# Patient Record
Sex: Female | Born: 1945 | Race: White | Hispanic: No | Marital: Married | Smoking: Former smoker
Health system: Southern US, Community
[De-identification: ages and names within clinical notes are randomized; demographics above are authoritative.]

## PROBLEM LIST (undated history)

## (undated) DIAGNOSIS — R053 Chronic cough: Secondary | ICD-10-CM

## (undated) DIAGNOSIS — T8859XA Other complications of anesthesia, initial encounter: Secondary | ICD-10-CM

## (undated) DIAGNOSIS — T4145XA Adverse effect of unspecified anesthetic, initial encounter: Secondary | ICD-10-CM

## (undated) DIAGNOSIS — D649 Anemia, unspecified: Secondary | ICD-10-CM

## (undated) DIAGNOSIS — K5792 Diverticulitis of intestine, part unspecified, without perforation or abscess without bleeding: Secondary | ICD-10-CM

## (undated) DIAGNOSIS — K5790 Diverticulosis of intestine, part unspecified, without perforation or abscess without bleeding: Secondary | ICD-10-CM

## (undated) DIAGNOSIS — R05 Cough: Secondary | ICD-10-CM

## (undated) DIAGNOSIS — M3500 Sicca syndrome, unspecified: Secondary | ICD-10-CM

## (undated) DIAGNOSIS — D72819 Decreased white blood cell count, unspecified: Secondary | ICD-10-CM

## (undated) DIAGNOSIS — M154 Erosive (osteo)arthritis: Secondary | ICD-10-CM

## (undated) DIAGNOSIS — K219 Gastro-esophageal reflux disease without esophagitis: Secondary | ICD-10-CM

## (undated) DIAGNOSIS — F419 Anxiety disorder, unspecified: Secondary | ICD-10-CM

## (undated) HISTORY — PX: ABDOMINAL HYSTERECTOMY: SHX81

## (undated) HISTORY — PX: TUBAL LIGATION: SHX77

## (undated) HISTORY — PX: CHOLECYSTECTOMY OPEN: SUR202

## (undated) HISTORY — PX: BREAST BIOPSY: SHX20

## (undated) HISTORY — PX: UPPER GI ENDOSCOPY: SHX6162

## (undated) HISTORY — PX: APPENDECTOMY: SHX54

## (undated) HISTORY — PX: TONSILLECTOMY: SUR1361

## (undated) NOTE — Telephone Encounter (Signed)
 Formatting of this note might be different from the original. 7/29 spoke with patient about her records being forwarded patient advised me that she received a call from the office and it was taken care of already Electronically signed by Pamila Solomons at 04/11/2021 11:07 AM EDT

---

## 2012-09-14 DIAGNOSIS — M3500 Sicca syndrome, unspecified: Secondary | ICD-10-CM

## 2012-09-14 HISTORY — DX: Sjogren syndrome, unspecified: M35.00

## 2016-05-21 ENCOUNTER — Encounter (HOSPITAL_COMMUNITY): Payer: Self-pay | Admitting: Family Medicine

## 2016-05-21 DIAGNOSIS — Z87891 Personal history of nicotine dependence: Secondary | ICD-10-CM | POA: Insufficient documentation

## 2016-05-21 DIAGNOSIS — R070 Pain in throat: Secondary | ICD-10-CM | POA: Insufficient documentation

## 2016-05-21 DIAGNOSIS — J029 Acute pharyngitis, unspecified: Secondary | ICD-10-CM | POA: Diagnosis present

## 2016-05-21 DIAGNOSIS — M3504 Sicca syndrome with tubulo-interstitial nephropathy: Secondary | ICD-10-CM | POA: Diagnosis not present

## 2016-05-21 LAB — URINALYSIS, ROUTINE W REFLEX MICROSCOPIC
BILIRUBIN URINE: NEGATIVE
GLUCOSE, UA: NEGATIVE mg/dL
Ketones, ur: NEGATIVE mg/dL
Leukocytes, UA: NEGATIVE
Nitrite: NEGATIVE
PROTEIN: NEGATIVE mg/dL
SPECIFIC GRAVITY, URINE: 1.009 (ref 1.005–1.030)
pH: 7.5 (ref 5.0–8.0)

## 2016-05-21 LAB — COMPREHENSIVE METABOLIC PANEL
ALBUMIN: 4 g/dL (ref 3.5–5.0)
ALK PHOS: 53 U/L (ref 38–126)
ALT: 14 U/L (ref 14–54)
AST: 17 U/L (ref 15–41)
Anion gap: 5 (ref 5–15)
BUN: 6 mg/dL (ref 6–20)
CALCIUM: 9.5 mg/dL (ref 8.9–10.3)
CHLORIDE: 100 mmol/L — AB (ref 101–111)
CO2: 26 mmol/L (ref 22–32)
CREATININE: 0.86 mg/dL (ref 0.44–1.00)
GFR calc Af Amer: >60 mL/min (ref >60)
GFR calc non Af Amer: >60 mL/min (ref >60)
GLUCOSE: 102 mg/dL — AB (ref 65–99)
Potassium: 4.1 mmol/L (ref 3.5–5.1)
SODIUM: 131 mmol/L — AB (ref 135–145)
Total Bilirubin: 0.5 mg/dL (ref 0.3–1.2)
Total Protein: 7 g/dL (ref 6.5–8.1)

## 2016-05-21 LAB — CBC
HCT: 35.2 % — ABNORMAL LOW (ref 36.0–46.0)
Hemoglobin: 12.1 g/dL (ref 12.0–15.0)
MCH: 32.6 pg (ref 26.0–34.0)
MCHC: 34.4 g/dL (ref 30.0–36.0)
MCV: 94.9 fL (ref 78.0–100.0)
PLATELETS: 188 10*3/uL (ref 150–400)
RBC: 3.71 MIL/uL — ABNORMAL LOW (ref 3.87–5.11)
RDW: 11.6 % (ref 11.5–15.5)
WBC: 5.5 10*3/uL (ref 4.0–10.5)

## 2016-05-21 LAB — LIPASE, BLOOD: LIPASE: 54 U/L — AB (ref 11–51)

## 2016-05-21 LAB — URINE MICROSCOPIC-ADD ON

## 2016-05-21 NOTE — ED Notes (Signed)
Pt alert, NAD, calm, interactive, resps e/u, no dyspnea noted, speech clear, steady gait, updated on wait and process with rationale.

## 2016-05-21 NOTE — ED Triage Notes (Signed)
Pt presents via POV with c/o sore throat, cough x5 weeks and abdominal x3 days.  [place] [place], and does not have her records with her.  She states had endoscopy 2 days ago to r/o reflux - states no abnormalities were found.  She was taking protonix but stopped 3 days ago due to abdominal pain.  Taking Carafate x 10days with minimal relief. PT is A&O and ambulatory to treatment room, in NAD, no cough during triage assessment.

## 2016-05-21 NOTE — ED Notes (Signed)
Updated on wait. 

## 2016-05-22 ENCOUNTER — Emergency Department (HOSPITAL_COMMUNITY)
Admission: EM | Admit: 2016-05-22 | Discharge: 2016-05-22 | Disposition: A | Discharge status: Home / Self Care | Payer: Medicare PPO | Attending: Emergency Medicine | Admitting: Emergency Medicine

## 2016-05-22 ENCOUNTER — Telehealth: Payer: Self-pay | Admitting: *Deleted

## 2016-05-22 ENCOUNTER — Emergency Department (HOSPITAL_COMMUNITY): Payer: Medicare PPO

## 2016-05-22 DIAGNOSIS — R070 Pain in throat: Secondary | ICD-10-CM

## 2016-05-22 DIAGNOSIS — M3500 Sicca syndrome, unspecified: Secondary | ICD-10-CM

## 2016-05-22 HISTORY — DX: Cough: R05

## 2016-05-22 HISTORY — DX: Chronic cough: R05.3

## 2016-05-22 HISTORY — DX: Decreased white blood cell count, unspecified: D72.819

## 2016-05-22 HISTORY — DX: Diverticulitis of intestine, part unspecified, without perforation or abscess without bleeding: K57.92

## 2016-05-22 HISTORY — DX: Sicca syndrome, unspecified: M35.00

## 2016-05-22 HISTORY — DX: Diverticulosis of intestine, part unspecified, without perforation or abscess without bleeding: K57.90

## 2016-05-22 MED ORDER — MAGIC MOUTHWASH W/LIDOCAINE: 5.0000 mL | Freq: Three times a day (TID) | ORAL | 0 refills | Status: DC | PRN

## 2016-05-22 NOTE — ED Notes (Signed)
Patient left at this time with all belongings. 

## 2016-05-22 NOTE — ED Notes (Signed)
Patient anxious to leave, up in hallway at desk asking for paperwork. Provided, and apologized for wait. Encouraged to follow up with otolaryngology.
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
## 2016-05-22 NOTE — ED Notes (Signed)
Pt states she's feeling better than when she first came in. States "I'll feel better once I can go home." Believes pain may be a flare up of autoimmune issue.

## 2016-05-22 NOTE — ED Provider Notes (Signed)
Patient presented to the ER with cough and sore throat with abdominal pain. Symptoms ongoing for several days.  Face to face Exam: HEENT - PERRLA Lungs - CTAB Heart - RRR, no M/R/G Abd - S/NT/ND Neuro - alert, oriented x3  Plan: Patient has history of recurrent symptoms similar to this in the past related to Sjogren's. She also has a history of reflux. She has had extensive workup recently including endoscopy within the last week. Workup is unremarkable here in the ER.   Gilda Creasehristopher J Walfred Bettendorf, MD 05/22/16 [phone]

## 2016-05-22 NOTE — Telephone Encounter (Signed)
Pharmacy called for [employer] Mouthwash ingredients.  Magic Mouthwash with lidocaine: 120ml Magic mouthwash solution (60 Maalox Extra Strength, 60ml Nystatin Suspension, 20ml of Benedryl)  and 40ml 2% Lidocaine.

## 2016-05-26 ENCOUNTER — Encounter (HOSPITAL_COMMUNITY): Payer: Self-pay | Admitting: Emergency Medicine

## 2016-05-26 ENCOUNTER — Emergency Department (HOSPITAL_COMMUNITY): Payer: Medicare PPO

## 2016-05-26 ENCOUNTER — Observation Stay (HOSPITAL_COMMUNITY)
Admission: EM | Admit: 2016-05-26 | Discharge: 2016-05-27 | Disposition: A | Discharge status: Home / Self Care | Payer: Medicare PPO | Attending: Internal Medicine | Admitting: Internal Medicine

## 2016-05-26 DIAGNOSIS — K838 Other specified diseases of biliary tract: Secondary | ICD-10-CM | POA: Diagnosis present

## 2016-05-26 DIAGNOSIS — R195 Other fecal abnormalities: Secondary | ICD-10-CM | POA: Diagnosis present

## 2016-05-26 DIAGNOSIS — R1010 Upper abdominal pain, unspecified: Secondary | ICD-10-CM

## 2016-05-26 DIAGNOSIS — Z79890 Hormone replacement therapy (postmenopausal): Secondary | ICD-10-CM | POA: Insufficient documentation

## 2016-05-26 DIAGNOSIS — R1011 Right upper quadrant pain: Principal | ICD-10-CM | POA: Diagnosis present

## 2016-05-26 DIAGNOSIS — K915 Postcholecystectomy syndrome: Secondary | ICD-10-CM | POA: Diagnosis not present

## 2016-05-26 DIAGNOSIS — Z79899 Other long term (current) drug therapy: Secondary | ICD-10-CM | POA: Insufficient documentation

## 2016-05-26 DIAGNOSIS — K210 Gastro-esophageal reflux disease with esophagitis: Secondary | ICD-10-CM | POA: Diagnosis not present

## 2016-05-26 DIAGNOSIS — Z87891 Personal history of nicotine dependence: Secondary | ICD-10-CM | POA: Diagnosis not present

## 2016-05-26 DIAGNOSIS — K219 Gastro-esophageal reflux disease without esophagitis: Secondary | ICD-10-CM | POA: Diagnosis present

## 2016-05-26 HISTORY — DX: Erosive (osteo)arthritis: M15.4

## 2016-05-26 HISTORY — DX: Other complications of anesthesia, initial encounter: T88.59XA

## 2016-05-26 HISTORY — DX: Anemia, unspecified: D64.9

## 2016-05-26 HISTORY — DX: Anxiety disorder, unspecified: F41.9

## 2016-05-26 HISTORY — DX: Adverse effect of unspecified anesthetic, initial encounter: T41.45XA

## 2016-05-26 HISTORY — DX: Gastro-esophageal reflux disease without esophagitis: K21.9

## 2016-05-26 LAB — URINALYSIS, ROUTINE W REFLEX MICROSCOPIC
Bilirubin Urine: NEGATIVE
Glucose, UA: NEGATIVE mg/dL
Ketones, ur: NEGATIVE mg/dL
LEUKOCYTES UA: NEGATIVE
NITRITE: NEGATIVE
PROTEIN: NEGATIVE mg/dL
Specific Gravity, Urine: <1.005 — ABNORMAL LOW (ref 1.005–1.030)
pH: 7 (ref 5.0–8.0)

## 2016-05-26 LAB — COMPREHENSIVE METABOLIC PANEL
ALBUMIN: 4.5 g/dL (ref 3.5–5.0)
ALK PHOS: 47 U/L (ref 38–126)
ALT: 15 U/L (ref 14–54)
ANION GAP: 9 (ref 5–15)
AST: 20 U/L (ref 15–41)
BILIRUBIN TOTAL: 1.1 mg/dL (ref 0.3–1.2)
BUN: 11 mg/dL (ref 6–20)
CHLORIDE: 101 mmol/L (ref 101–111)
CO2: 25 mmol/L (ref 22–32)
CREATININE: 0.98 mg/dL (ref 0.44–1.00)
Calcium: 9.8 mg/dL (ref 8.9–10.3)
GFR calc Af Amer: >60 mL/min (ref >60)
GFR calc non Af Amer: 58 mL/min — ABNORMAL LOW (ref >60)
Glucose, Bld: 106 mg/dL — ABNORMAL HIGH (ref 65–99)
Potassium: 4.3 mmol/L (ref 3.5–5.1)
SODIUM: 135 mmol/L (ref 135–145)
Total Protein: 7.7 g/dL (ref 6.5–8.1)

## 2016-05-26 LAB — URINE MICROSCOPIC-ADD ON
BACTERIA UA: NONE SEEN
WBC UA: NONE SEEN WBC/hpf (ref 0–5)

## 2016-05-26 LAB — CBC
HCT: 37.3 % (ref 36.0–46.0)
Hemoglobin: 12.6 g/dL (ref 12.0–15.0)
MCH: 32.2 pg (ref 26.0–34.0)
MCHC: 33.8 g/dL (ref 30.0–36.0)
MCV: 95.4 fL (ref 78.0–100.0)
PLATELETS: 203 10*3/uL (ref 150–400)
RBC: 3.91 MIL/uL (ref 3.87–5.11)
RDW: 11.8 % (ref 11.5–15.5)
WBC: 4.1 10*3/uL (ref 4.0–10.5)

## 2016-05-26 LAB — LIPASE, BLOOD: Lipase: 38 U/L (ref 11–51)

## 2016-05-26 MED ORDER — MORPHINE SULFATE (PF) 2 MG/ML IV SOLN: 1.0000 mg | INTRAVENOUS | Status: DC | PRN

## 2016-05-26 MED ORDER — ACETAMINOPHEN 325 MG PO TABS: 650.0000 mg | ORAL_TABLET | Freq: Four times a day (QID) | ORAL | Status: DC | PRN

## 2016-05-26 MED ORDER — ONDANSETRON HCL 4 MG/2ML IJ SOLN
4.0000 mg | Freq: Once | INTRAMUSCULAR | Status: AC
  Administered 2016-05-26: 4 mg via INTRAVENOUS
  Filled 2016-05-26: qty 2

## 2016-05-26 MED ORDER — MORPHINE SULFATE (PF) 4 MG/ML IV SOLN
4.0000 mg | Freq: Once | INTRAVENOUS | Status: AC
Start: 2016-05-26 — End: 2016-05-26
  Administered 2016-05-26: 4 mg via INTRAVENOUS
  Filled 2016-05-26: qty 1

## 2016-05-26 MED ORDER — ONDANSETRON HCL 4 MG/2ML IJ SOLN: 4.0000 mg | Freq: Four times a day (QID) | INTRAMUSCULAR | Status: DC | PRN

## 2016-05-26 MED ORDER — GI COCKTAIL ~~LOC~~
30.0000 mL | Freq: Once | ORAL | Status: AC
  Administered 2016-05-26: 30 mL via ORAL
  Filled 2016-05-26: qty 30

## 2016-05-26 MED ORDER — FAMOTIDINE IN NACL 20-0.9 MG/50ML-% IV SOLN
20.0000 mg | Freq: Two times a day (BID) | INTRAVENOUS | Status: DC
  Administered 2016-05-26 (×2): 20 mg via INTRAVENOUS
  Filled 2016-05-26 (×3): qty 50

## 2016-05-26 MED ORDER — MORPHINE SULFATE (PF) 4 MG/ML IV SOLN
4.0000 mg | Freq: Once | INTRAVENOUS | Status: AC
  Administered 2016-05-26: 4 mg via INTRAVENOUS
  Filled 2016-05-26: qty 1

## 2016-05-26 MED ORDER — ONDANSETRON HCL 4 MG PO TABS: 4.0000 mg | ORAL_TABLET | Freq: Four times a day (QID) | ORAL | Status: DC | PRN

## 2016-05-26 MED ORDER — SODIUM CHLORIDE 0.9 % IV SOLN
INTRAVENOUS | Status: DC
  Administered 2016-05-26: 16:00:00 via INTRAVENOUS
  Administered 2016-05-27: 1000 mL via INTRAVENOUS

## 2016-05-26 MED ORDER — ENOXAPARIN SODIUM 40 MG/0.4ML ~~LOC~~ SOLN: 40.0000 mg | Freq: Every day | SUBCUTANEOUS | Status: DC

## 2016-05-26 MED ORDER — ACETAMINOPHEN 650 MG RE SUPP: 650.0000 mg | Freq: Four times a day (QID) | RECTAL | Status: DC | PRN
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
## 2016-05-26 NOTE — ED Notes (Signed)
NPO x6 hrs for imaging

## 2016-05-26 NOTE — ED Triage Notes (Signed)
Pt reports RUQ pain x 3 weeks with nausea and some diarrhea.  Pt is from out town and was seen here this past Thursday for GI issues but didn't report her pain was as significant in the RUQ area as it is.  Pt states she is having pain in her back and it is difficult to breath.  Pt A&O.

## 2016-05-26 NOTE — ED Notes (Signed)
Ambulated pt to the bathroom.  She tolerated well, but endorsed a bit of dizziness, which she attributes to the morphine.

## 2016-05-26 NOTE — ED Notes (Signed)
Attempted report 

## 2016-05-26 NOTE — ED Provider Notes (Signed)
[facility]-EMERGENCY DEPT Provider Note   CSN: [id number] Arrival date & time: 05/26/16  0944     History   Chief Complaint Chief Complaint  Patient presents with  . Abdominal Pain    HPI Katelyn Weaver is a 70 y.o. female.  70 year old female with past medical history including Sjogren's syndrome, diverticulitis, benign leukopenia who presents with abdominal pain. The patient reports a several week history of ongoing abdominal pain. Over the past 5 weeks she has had workup including endoscopy by her gastroenterologist, CT of abdomen and pelvis that was negative for diverticulitis. She was initially started on antibiotics for diverticulitis but then was told to discontinue them after the CT scan was reassuring. She later took a course of Augmentin for a sinus infection and thinks that it messed up her liver. She has been on a protonic's recently as well as Carafate but has discontinued both of these because she felt like they made the symptoms worse. She was evaluated here on 9/8 and discharged home but she reports that at the time she was saying left-sided pain but is currently having more severe pain in the midepigastrium and right upper quadrant. The pain radiates to her back and is severe and constant. Patient states that "I think my liver is hurting" and she also complains of throat burning. She also endorses urinary frequency recently. She endorses associated nausea and nonbloody diarrhea, no vomiting. MAXIMUM TEMPERATURE 99.4 at home.   The history is provided by the patient.  Abdominal Pain      Past Medical History:  Diagnosis Date  . Chronic cough   . Diverticulitis   . Diverticulosis   . Leukopenia   . Sicca syndrome, Sjogren's (HCC) 2014    Patient Active Problem List   Diagnosis Date Noted  . Abdominal pain 05/26/2016    Past Surgical History:  Procedure Laterality Date  . ABDOMINAL HYSTERECTOMY    . APPENDECTOMY    . CHOLECYSTECTOMY    . TONSILLECTOMY    . UPPER  GI ENDOSCOPY      OB History    No data available       Home Medications    Prior to Admission medications   Medication Sig Start Date End Date Taking? Authorizing Provider  estradiol (ESTRACE) 0.5 MG tablet Take 0.5 mg by mouth every other day.   Yes Historical Provider, MD  ibuprofen (ADVIL,MOTRIN) 800 MG tablet Take 800 mg by mouth every 8 (eight) hours as needed.   Yes Historical Provider, MD  Probiotic Product (PROBIOTIC-10) CHEW Chew 1 capsule by mouth daily.   Yes Historical Provider, MD  pseudoephedrine-guaifenesin (MUCINEX D) 60-600 MG 12 hr tablet Take 1 tablet by mouth every 12 (twelve) hours.   Yes Historical Provider, MD  ranitidine (ZANTAC) 150 MG tablet Take 150 mg by mouth 2 (two) times daily.   Yes Historical Provider, MD  Turmeric 500 MG CAPS Take 1,000 mg by mouth 2 (two) times daily.   Yes Historical Provider, MD    Family History History reviewed. No pertinent family history.  Social History Social History  Substance Use Topics  . Smoking status: Former Games developer  . Smokeless tobacco: Former Neurosurgeon  . Alcohol use No     Allergies   Sulfa antibiotics   Review of Systems Review of Systems  Gastrointestinal: Positive for abdominal pain.   10 Systems reviewed and are negative for acute change except as noted in the HPI.   Physical Exam Updated Vital Signs BP 126/73 (BP Location: Left  Arm)   Pulse 65   Temp 98.3 F (36.8 C) (Oral)   Resp 20   SpO2 100%   Physical Exam  Constitutional: She is oriented to person, place, and time. She appears well-developed and well-nourished. No distress.  Uncomfortable, holding abdomen, anxious  HENT:  Head: Normocephalic and atraumatic.  Moist mucous membranes  Eyes: Conjunctivae are normal. Pupils are equal, round, and reactive to light.  Neck: Neck supple.  Cardiovascular: Normal rate, regular rhythm and normal heart sounds.   No murmur heard. Pulmonary/Chest: Effort normal and breath sounds normal.    Abdominal: Soft. Bowel sounds are normal. She exhibits no distension. There is tenderness (RUQ, mid-epigastrium). There is no rebound and no guarding.  Musculoskeletal: She exhibits no edema.  Neurological: She is alert and oriented to person, place, and time.  Fluent speech  Skin: Skin is warm and dry.  Psychiatric:  anxious  Nursing note and vitals reviewed.    ED Treatments / Results  Labs (all labs ordered are listed, but only abnormal results are displayed) Labs Reviewed  COMPREHENSIVE METABOLIC PANEL - Abnormal; Notable for the following:       Result Value   Glucose, Bld 106 (*)    GFR calc non Af Amer 58 (*)    All other components within normal limits  URINALYSIS, ROUTINE W REFLEX MICROSCOPIC (NOT AT [facility]) - Abnormal; Notable for the following:    Specific Gravity, Urine <1.005 (*)    Hgb urine dipstick SMALL (*)    All other components within normal limits  URINE MICROSCOPIC-ADD ON - Abnormal; Notable for the following:    Squamous Epithelial / LPF 0-5 (*)    All other components within normal limits  LIPASE, BLOOD  CBC    EKG  EKG Interpretation None       Radiology US Abdomen Complete  Result Date: 05/26/2016 CLINICAL DATA:  Right upper quadrant pain for 3 weeks. Cholecystectomy. EXAM: ABDOMEN ULTRASOUND COMPLETE COMPARISON:  None. FINDINGS: Gallbladder: The gallbladder is surgically absent. Common bile duct: Diameter: 12 mm Liver: No focal lesion identified. Within normal limits in parenchymal echogenicity. IVC: No abnormality visualized. Pancreas: Visualized portion unremarkable. Spleen: Size and appearance within normal limits. Right Kidney: Length: 9.5 cm. Echogenicity within normal limits. No mass or hydronephrosis visualized. Left Kidney: Length: 9.9 cm. Echogenicity within normal limits. No mass or hydronephrosis visualized. Abdominal aorta: No aneurysm visualized. Other findings: None. IMPRESSION: 1. The common bile duct is dilated measuring 12 mm. There  are no previous studies for comparison. The patient is status post cholecystectomy which can lead to dilatation of the common bile duct. Recommend correlation with LFTs. If there is concern for obstruction, an MRCP could better evaluate. Electronically Signed   By: Gerome Sam III M.D   On: 05/26/2016 12:53    Procedures Procedures (including critical care time)  Medications Ordered in ED Medications  morphine 4 MG/ML injection 4 mg (not administered)  morphine 4 MG/ML injection 4 mg (4 mg Intravenous Given 05/26/16 1139)  ondansetron (ZOFRAN) injection 4 mg (4 mg Intravenous Given 05/26/16 1139)     Initial Impression / Assessment and Plan / ED Course  I have reviewed the triage vital signs and the nursing notes.  Pertinent labs & imaging results that were available during my care of the patient were reviewed by me and considered in my medical decision making (see chart for details).  Clinical Course   Patient with ongoing abdominal pain for the past several weeks. She [place] recent  [place]oscopy and CT scan. She was here on 9/8 for the same symptoms where lab work was unremarkable. Today she presents for ongoing symptoms. She was anxious on exam, vital signs reassuring. Right upper quadrant and midepigastric tenderness with no abdominal distention or peritonitis. Gave the patient Zofran and morphine and obtained above lab work as well as abdominal ultrasound.  Labwork reassuring. Ultrasound shows dilated common bile duct at 12mm, unclear significance. The patient continues to have ongoing upper abdominal pain therefore I contacted gastroenterology and discussed with Dr.Magod. He recommended MRCP set up as outpatient and was willing to follow up with her in clinic. However, patient is not from this state and I am unable to set up MRCP as outpatient study. I discussed admission with Revonda StandardAllison, hospitalist NP, and patient will be admitted for  MRCP and further work up.    Final Clinical Impressions(s) / ED Diagnoses   Final diagnoses:  Pain of upper abdomen  Common bile duct dilatation    New Prescriptions New Prescriptions   No medications on file     Laurence Spatesachel Morgan Little, MD 05/26/16 1527

## 2016-05-27 ENCOUNTER — Observation Stay (HOSPITAL_COMMUNITY): Payer: Medicare PPO

## 2016-05-27 DIAGNOSIS — K838 Other specified diseases of biliary tract: Secondary | ICD-10-CM | POA: Diagnosis not present

## 2016-05-27 DIAGNOSIS — K210 Gastro-esophageal reflux disease with esophagitis: Secondary | ICD-10-CM

## 2016-05-27 DIAGNOSIS — R195 Other fecal abnormalities: Secondary | ICD-10-CM | POA: Diagnosis not present

## 2016-05-27 DIAGNOSIS — R1011 Right upper quadrant pain: Secondary | ICD-10-CM | POA: Diagnosis not present

## 2016-05-27 LAB — BASIC METABOLIC PANEL
ANION GAP: 7 (ref 5–15)
BUN: 9 mg/dL (ref 6–20)
CALCIUM: 8.8 mg/dL — AB (ref 8.9–10.3)
CO2: 23 mmol/L (ref 22–32)
CREATININE: 0.91 mg/dL (ref 0.44–1.00)
Chloride: 106 mmol/L (ref 101–111)
Glucose, Bld: 80 mg/dL (ref 65–99)
Potassium: 4.2 mmol/L (ref 3.5–5.1)
SODIUM: 136 mmol/L (ref 135–145)

## 2016-05-27 LAB — H PYLORI, IGM, IGG, IGA AB: H. Pylogi, Iga Abs: <9 units (ref 0.0–8.9)

## 2016-05-27 LAB — CBC
HEMATOCRIT: 32.1 % — AB (ref 36.0–46.0)
Hemoglobin: 10.9 g/dL — ABNORMAL LOW (ref 12.0–15.0)
MCH: 32.6 pg (ref 26.0–34.0)
MCHC: 34 g/dL (ref 30.0–36.0)
MCV: 96.1 fL (ref 78.0–100.0)
Platelets: 154 10*3/uL (ref 150–400)
RBC: 3.34 MIL/uL — ABNORMAL LOW (ref 3.87–5.11)
RDW: 12 % (ref 11.5–15.5)
WBC: 3.3 10*3/uL — AB (ref 4.0–10.5)

## 2016-05-27 MED ORDER — SUCRALFATE 1 GM/10ML PO SUSP: 1.0000 g | Freq: Three times a day (TID) | ORAL | Status: DC

## 2016-05-27 MED ORDER — ALUM & MAG HYDROXIDE-SIMETH 400-400-40 MG/5ML PO SUSP: 15.0000 mL | Freq: Four times a day (QID) | ORAL | 0 refills | Status: AC | PRN

## 2016-05-27 MED ORDER — PANTOPRAZOLE SODIUM 40 MG PO TBEC: 40.0000 mg | DELAYED_RELEASE_TABLET | Freq: Two times a day (BID) | ORAL | 0 refills | Status: AC

## 2016-05-27 MED ORDER — ONDANSETRON HCL 4 MG PO TABS: 4.0000 mg | ORAL_TABLET | Freq: Four times a day (QID) | ORAL | 0 refills | Status: AC | PRN

## 2016-05-27 MED ORDER — FLUCONAZOLE IN SODIUM CHLORIDE 200-0.9 MG/100ML-% IV SOLN
200.0000 mg | Freq: Once | INTRAVENOUS | Status: AC
  Administered 2016-05-27: 200 mg via INTRAVENOUS
  Filled 2016-05-27: qty 100

## 2016-05-27 MED ORDER — GADOBENATE DIMEGLUMINE 529 MG/ML IV SOLN
10.0000 mL | Freq: Once | INTRAVENOUS | Status: AC | PRN
  Administered 2016-05-27: 10 mL via INTRAVENOUS

## 2016-05-27 MED ORDER — PANTOPRAZOLE SODIUM 40 MG PO TBEC
40.0000 mg | DELAYED_RELEASE_TABLET | Freq: Two times a day (BID) | ORAL | Status: DC
  Administered 2016-05-27: 40 mg via ORAL
  Filled 2016-05-27: qty 1

## 2016-05-27 MED ORDER — SUCRALFATE 1 GM/10ML PO SUSP: 1.0000 g | Freq: Three times a day (TID) | ORAL | 0 refills | Status: AC
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
## 2017-09-29 IMAGING — MR MR ABDOMEN WO/W CM MRCP
10 of 19 series · 20 of 48 positions shown · IV contrast (multihance)
Comparison: Abdominal ultrasound dated 05/26/2016

CLINICAL DATA: Abdominal pain, dilated CBD on ultrasound

EXAM:
MRI ABDOMEN WITHOUT AND WITH CONTRAST (INCLUDING MRCP)
TECHNIQUE: Multiplanar multisequence MR imaging of the abdomen was performed
both before and after the administration of intravenous contrast.
Heavily T2-weighted images of the biliary and pancreatic ducts were
obtained, and three-dimensional MRCP images were rendered by post
processing.
CONTRAST:  10mL MULTIHANCE GADOBENATE DIMEGLUMINE 529 MG/ML IV SOLN

[Series 3: T2 · coronal · 5.0mm · 0.66mm/px · 2 of 33 slices shown (1 of 2)]
[im 1/33]
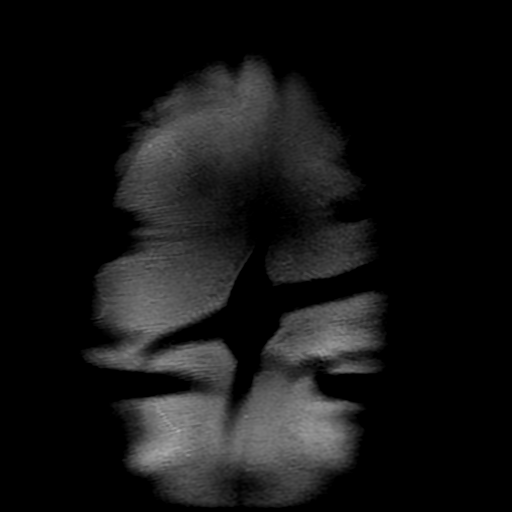
[im 33/33]
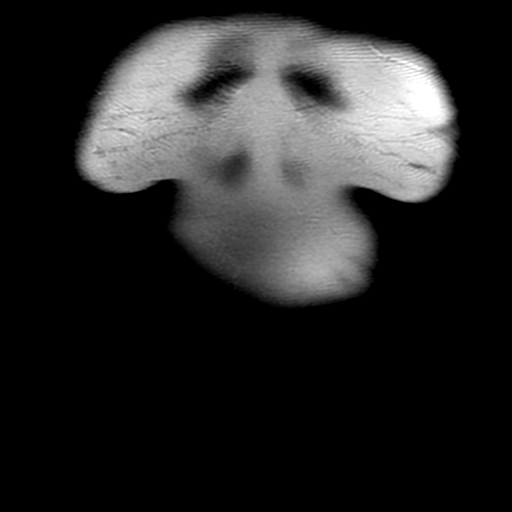

[Series 4: T2 fat-sat · axial · 5.0mm · 0.62mm/px · z∈[-113,+82]mm · 2 of 40 slices shown]
[im 1/40]
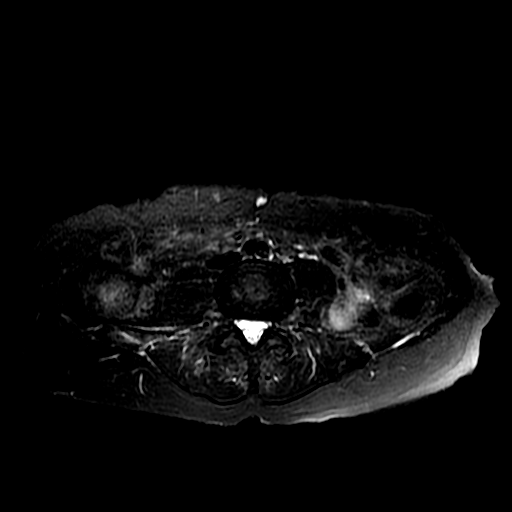
[im 40/40]
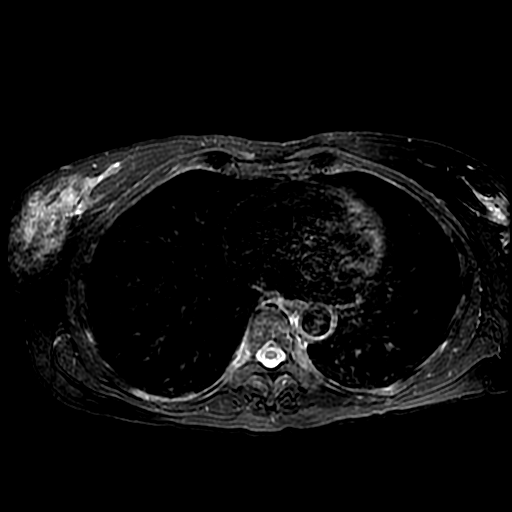

[Series 5: T2 · axial · 5.0mm · 0.62mm/px · z∈[-113,+82]mm · 2 of 40 slices shown (2 of 2)]
[im 1/40]
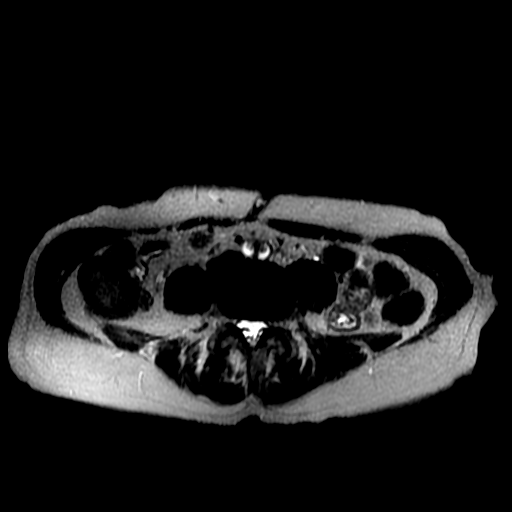
[im 40/40]
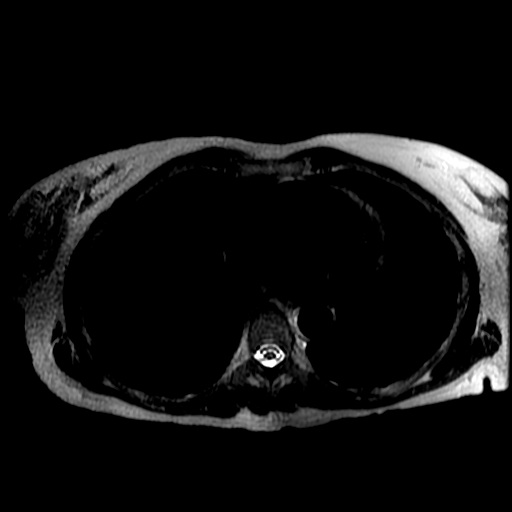

[Series 7: MRCP · coronal · 2.0mm · 0.66mm/px · 2 of 36 slices shown (1 of 2)]
[im 1/36]
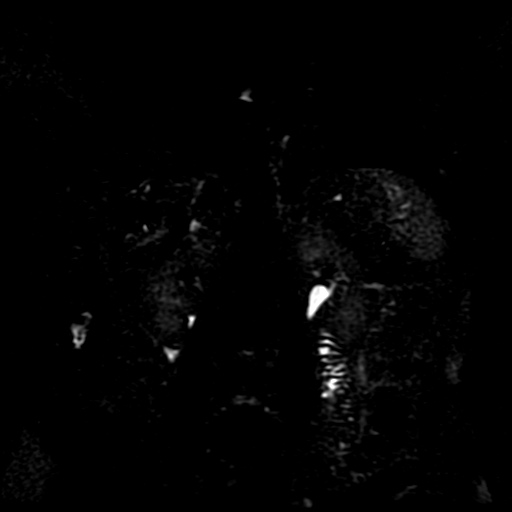
[im 36/36]
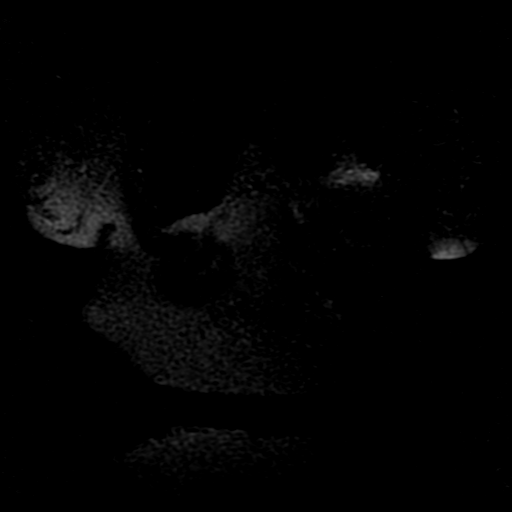

[Series 8: DWI b500 · axial · 6.0mm · 1.37mm/px · z∈[-113,+82]mm · 2 of 52 slices shown]
[im 1/52]
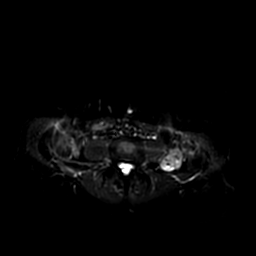
[im 52/52]
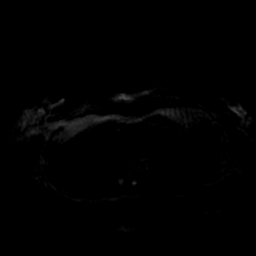

[Series 9: 3 plane bh · axial · 8.0mm · 0.78mm/px · 1 of 13 slices shown]
[im 1/13]
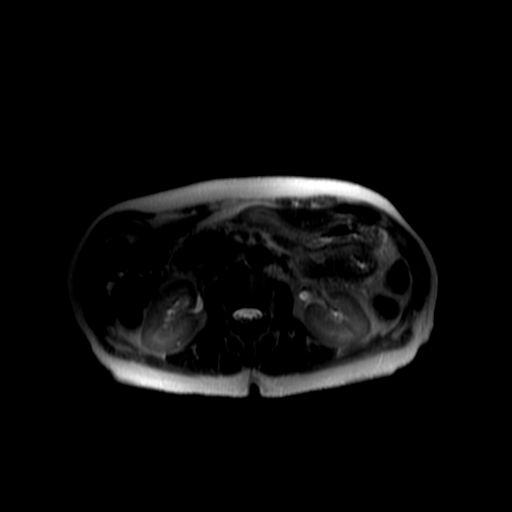

[Series 11: ax dualecho · axial · 5.0mm · 0.66mm/px · z∈[-113,+82]mm · 3 of 80 slices shown]
[im 1/80]
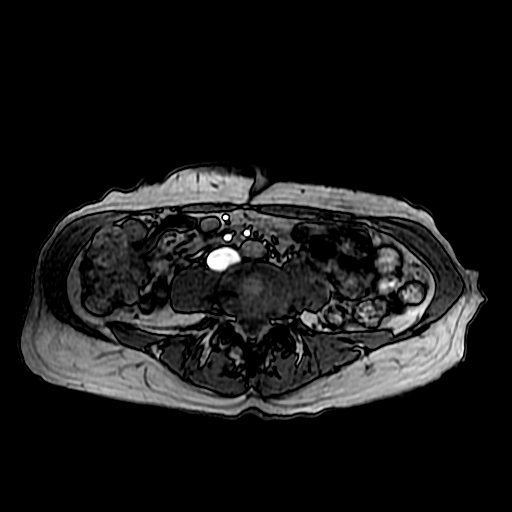
[im 40/80]
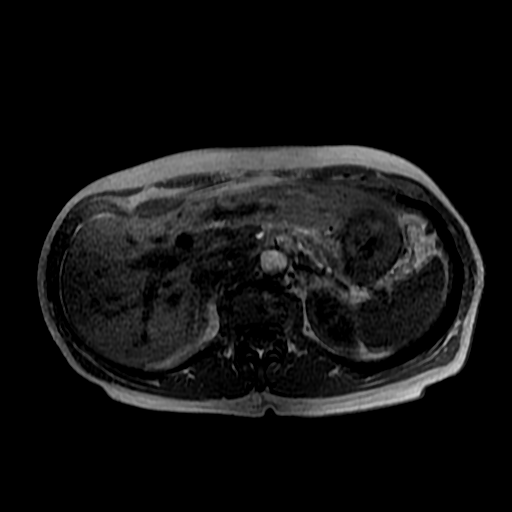
[im 80/80]
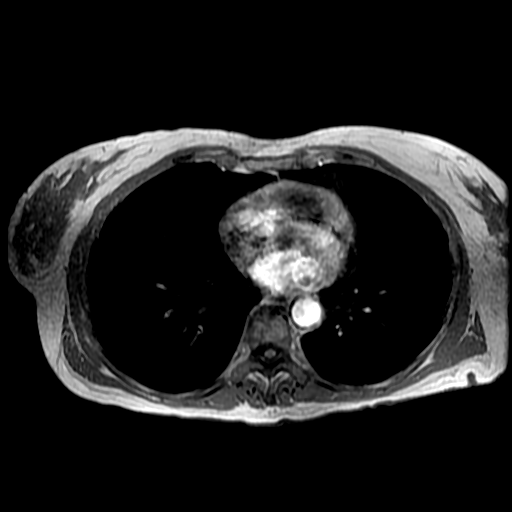

[Series 12: MRCP · coronal · 40.0mm · 0.70mm/px · 1 of 4 slices shown (2 of 2)]
[im 1/4]
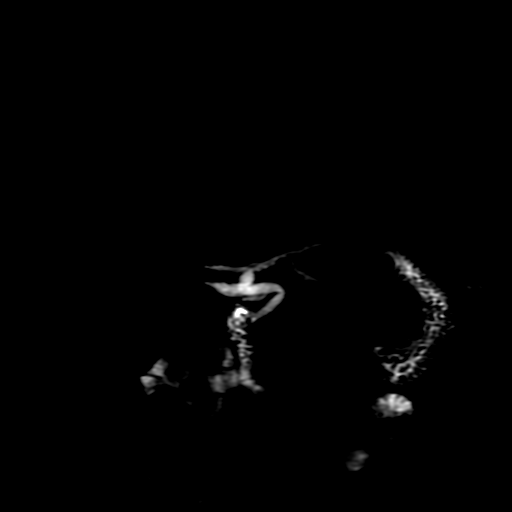

[Series 1400: T1 dynamic · axial · 5.0mm · 0.70mm/px · z∈[-111,+76]mm · 3 of 76 slices shown (1 of 2)]
[im 1/76]
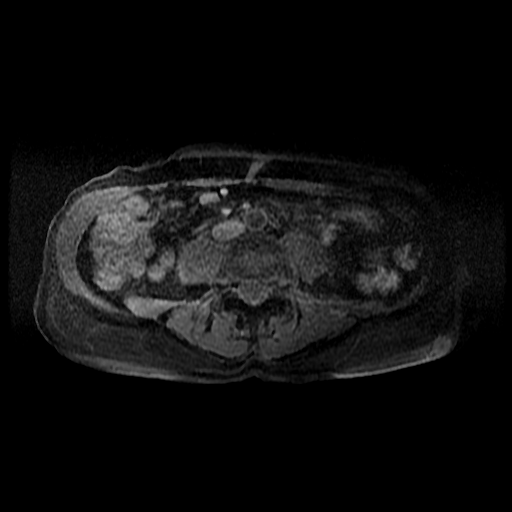
[im 38/76]
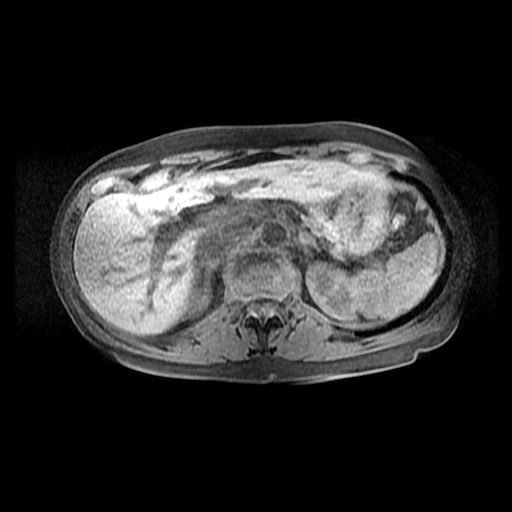
[im 76/76]
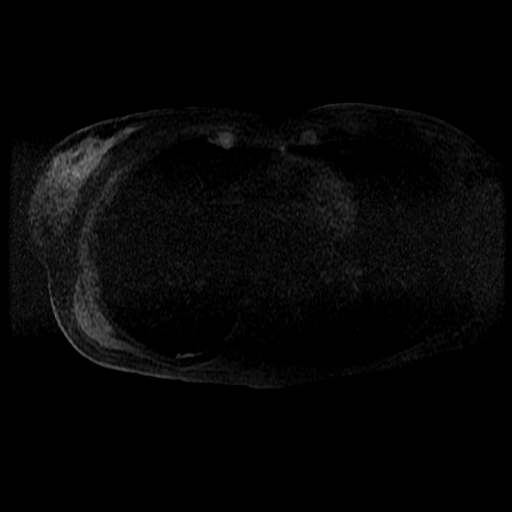

[Series 1401: T1 dynamic · axial · 5.0mm · 0.70mm/px · z∈[-111,-19]mm · 2 of 76 slices shown (2 of 2)]
[im 1/76]
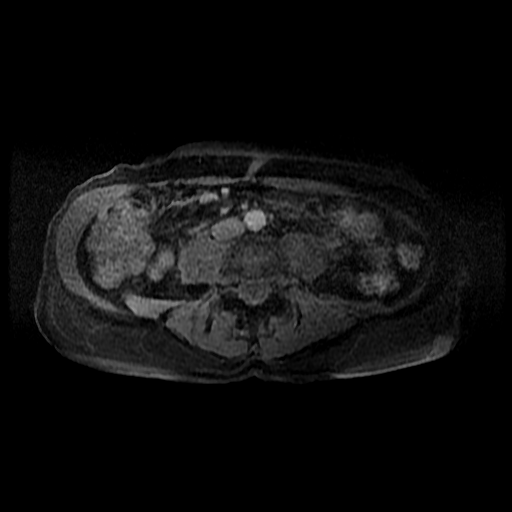
[im 38/76]
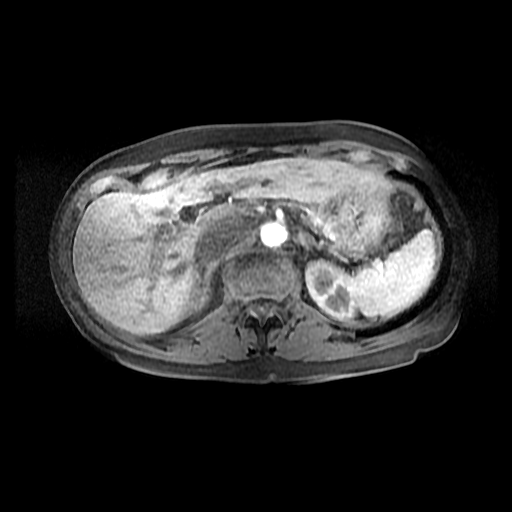

[20 of 48 positions shown; findings below may reference images not displayed]

FINDINGS: Lower chest: Lung bases are clear.

Hepatobiliary: Liver is within normal limits. No hepatic steatosis.
No suspicious/enhancing hepatic lesions.

Status post cholecystectomy. Mild central intrahepatic and
extrahepatic ductal dilatation. Common duct measures 8 mm and
smoothly tapers at the ampulla. No choledocholithiasis is seen.

Pancreas:  Within normal limits.

Spleen:  Within normal limits.

Adrenals/Urinary Tract:  Adrenal glands are within normal limits.

Kidneys are within normal limits.  No hydronephrosis.

Stomach/Bowel: Stomach is within normal limits.

Visualized bowel is unremarkable.

Vascular/Lymphatic:  No evidence of abdominal aortic aneurysm.

No suspicious abdominal lymphadenopathy.

Other:  No abdominal ascites.

Musculoskeletal: No focal osseous lesions.
IMPRESSION: Status post cholecystectomy. Common duct measures 8 mm and smoothly
tapers at the ampulla. No choledocholithiasis is seen.

## 2020-01-18 LAB — COLOGUARD: COLOGUARD: NEGATIVE
